# Patient Record
Sex: Female | Born: 1945 | Race: White | Hispanic: No | Marital: Married
Health system: Southern US, Community
[De-identification: ages and names within clinical notes are randomized; demographics above are authoritative.]

---

## 2001-12-18 ENCOUNTER — Ambulatory Visit (HOSPITAL_COMMUNITY): Admission: RE | Admit: 2001-12-18 | Discharge: 2001-12-18 | Payer: Self-pay | Admitting: *Deleted

## 2004-08-27 ENCOUNTER — Other Ambulatory Visit: Admission: RE | Admit: 2004-08-27 | Discharge: 2004-08-27 | Payer: Self-pay | Admitting: *Deleted

## 2005-10-09 ENCOUNTER — Other Ambulatory Visit: Admission: RE | Admit: 2005-10-09 | Discharge: 2005-10-09 | Payer: Self-pay | Admitting: *Deleted

## 2005-10-29 ENCOUNTER — Encounter: Admission: RE | Admit: 2005-10-29 | Discharge: 2005-10-29 | Payer: Self-pay | Admitting: Family Medicine

## 2005-12-06 ENCOUNTER — Ambulatory Visit (HOSPITAL_COMMUNITY): Admission: RE | Admit: 2005-12-06 | Discharge: 2005-12-06 | Payer: Self-pay | Admitting: Urology

## 2005-12-25 ENCOUNTER — Ambulatory Visit (HOSPITAL_COMMUNITY): Admission: RE | Admit: 2005-12-25 | Discharge: 2005-12-25 | Payer: Self-pay | Admitting: Surgery

## 2006-01-06 ENCOUNTER — Ambulatory Visit (HOSPITAL_BASED_OUTPATIENT_CLINIC_OR_DEPARTMENT_OTHER): Admission: RE | Admit: 2006-01-06 | Discharge: 2006-01-06 | Payer: Self-pay | Admitting: Surgery

## 2008-04-14 IMAGING — CT CT BIOPSY
1 series · 16 of 32 positions shown, 20 images · non-contrast
Comparison: none

CLINICAL DATA: Fat containing mass in the left rectus sheath noted on abdomen and pelvis CT.  She denies any history of growth or pain in the area.  
 CT GUIDED LEFT RECTUS SHEATH FATTY MASS BIOPSY:
 Procedure:  The left lower abdomen was prepped and draped in a sterile fashion.  Lidocaine was utilized for local anesthesia.  Under CT guidance, a 17-gauge guide needle was inserted into the fatty mass.  The tip was positioned in a soft tissue density within the mass.  
 Five 18-gauge core biopsy passes were performed.  The needle was removed.  Repeat imaging demonstrates no hematoma.

[Series 2: routine pelvis · axial · 0.81mm/px · z∈[-61,+29]mm · 16 of 38 slices shown, 20 images]
[im 3/38  soft-tissue]
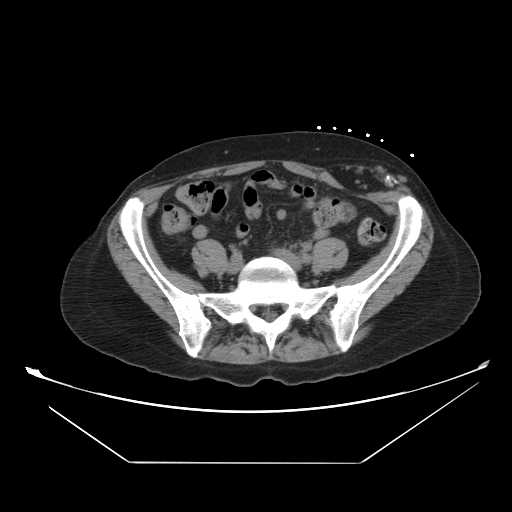
[im 3/38  bone]
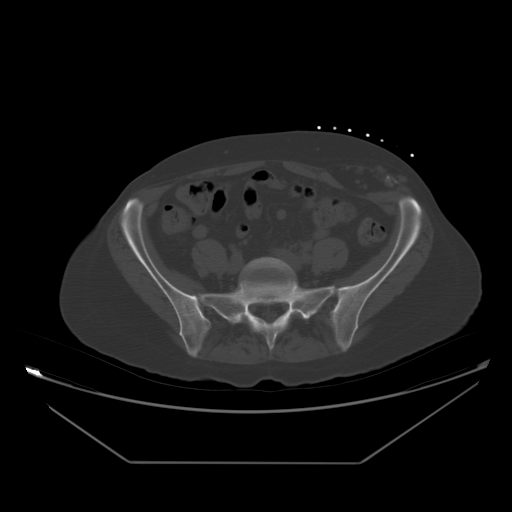
[im 5/38  soft-tissue]
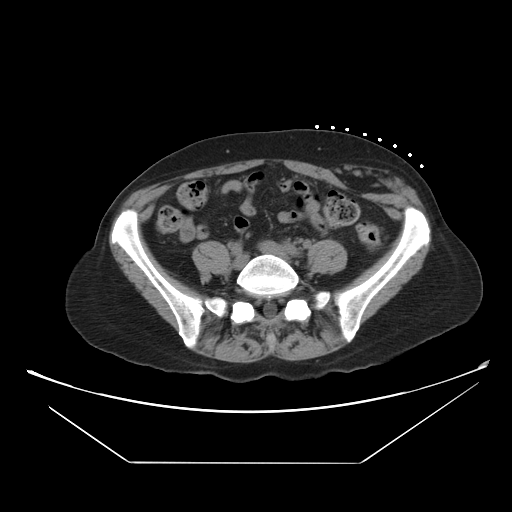
[im 8/38  soft-tissue]
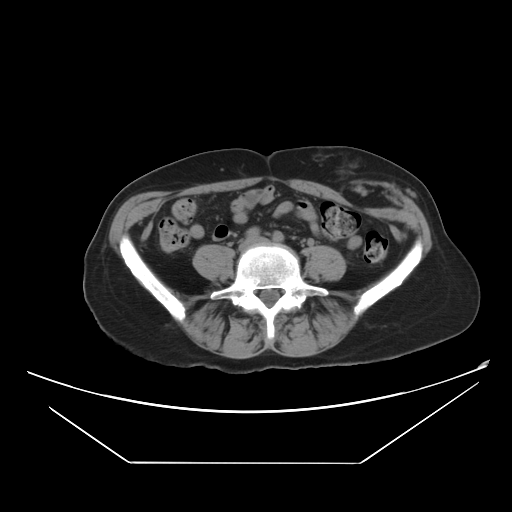
[im 10/38  soft-tissue]
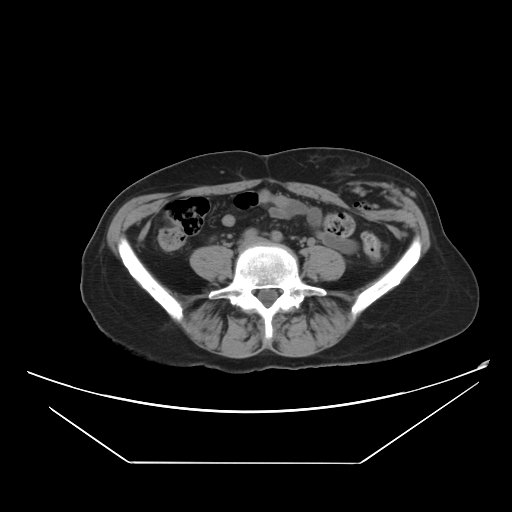
[im 12/38  soft-tissue]
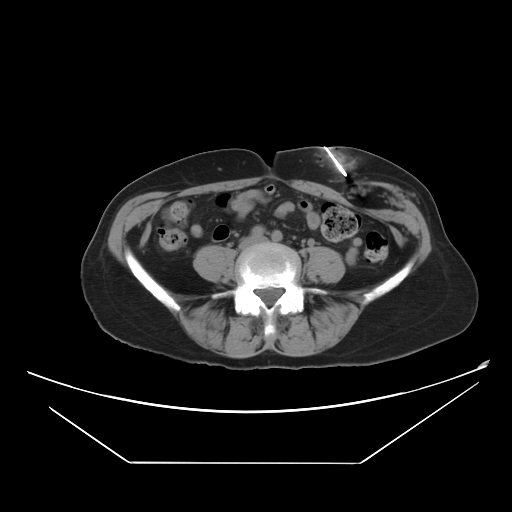
[im 15/38  soft-tissue]
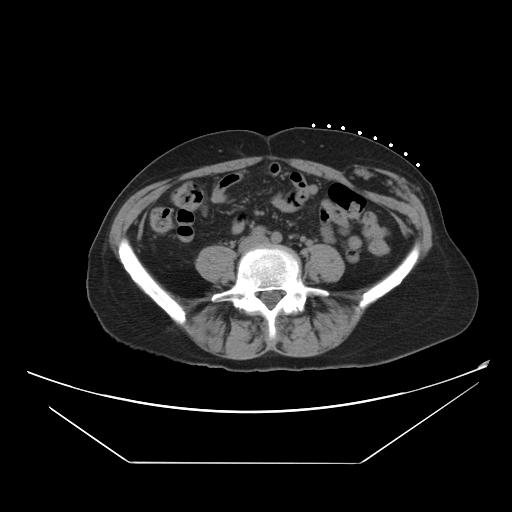
[im 17/38  soft-tissue]
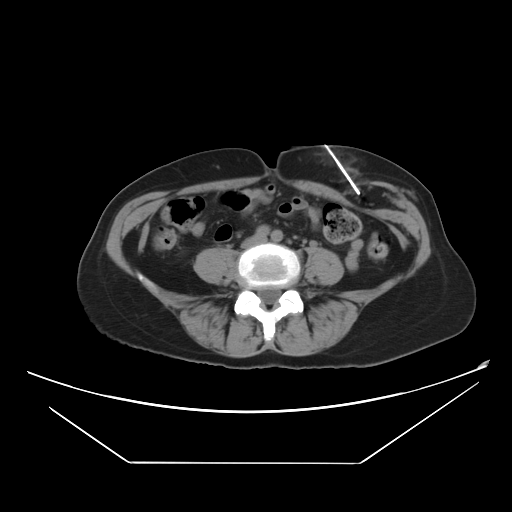
[im 21/38  soft-tissue]
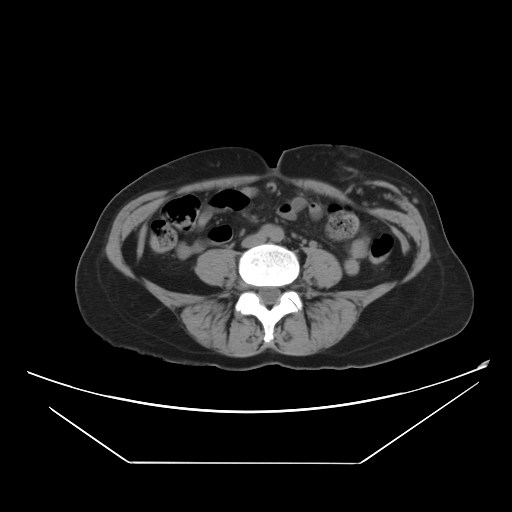
[im 23/38  soft-tissue]
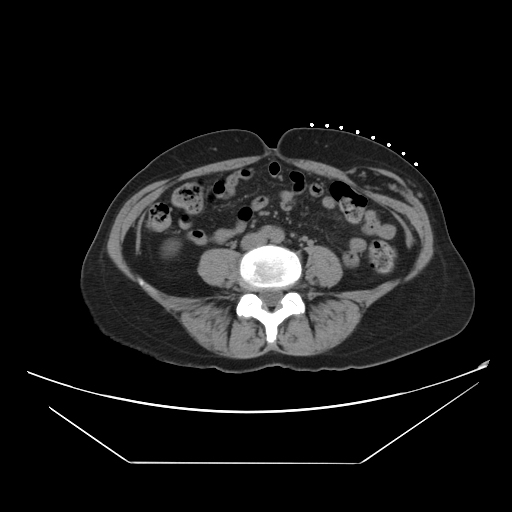
[im 23/38  bone]
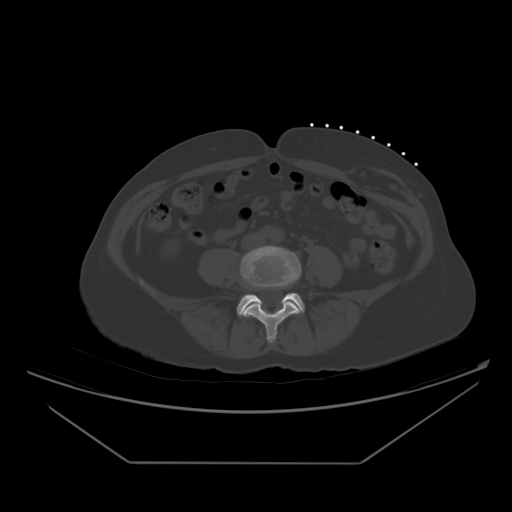
[im 26/38  soft-tissue]
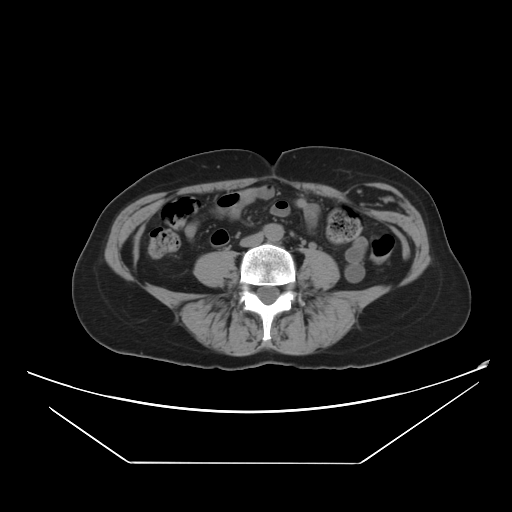
[im 28/38  soft-tissue]
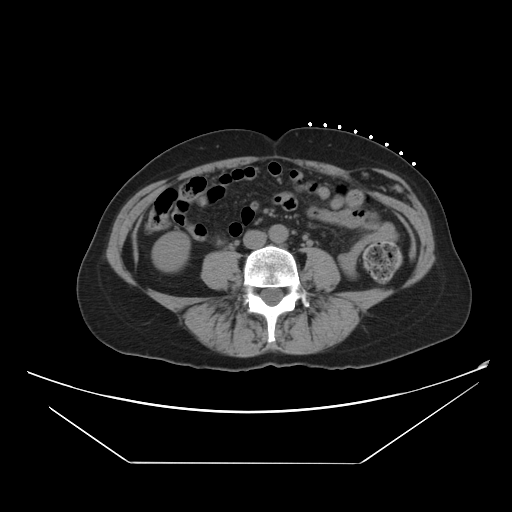
[im 30/38  soft-tissue]
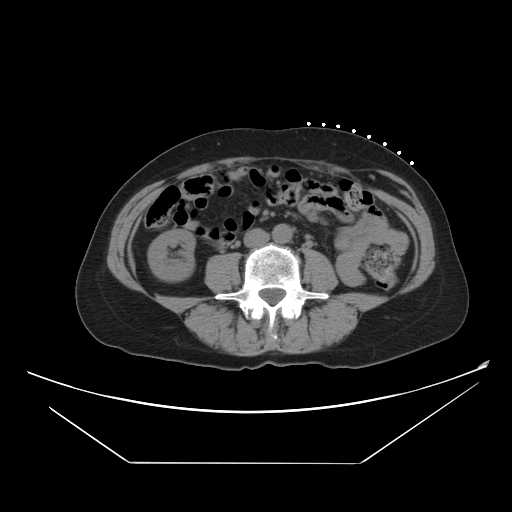
[im 33/38  soft-tissue]
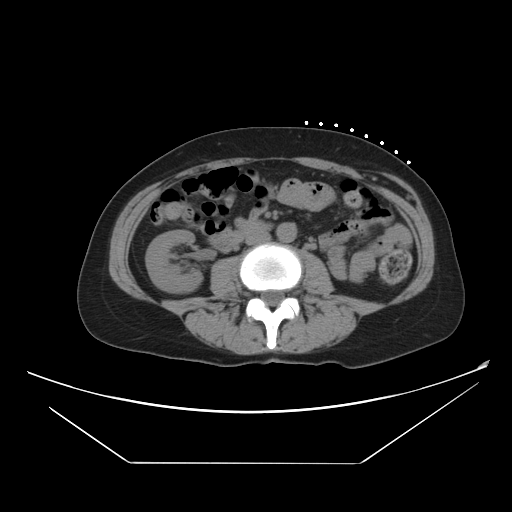
[im 33/38  lung]
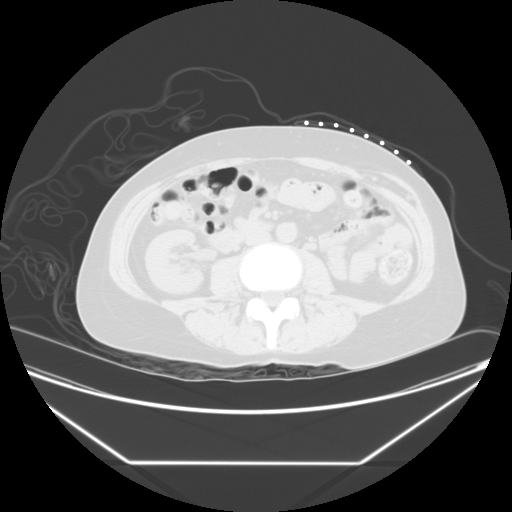
[im 34/38  lung]
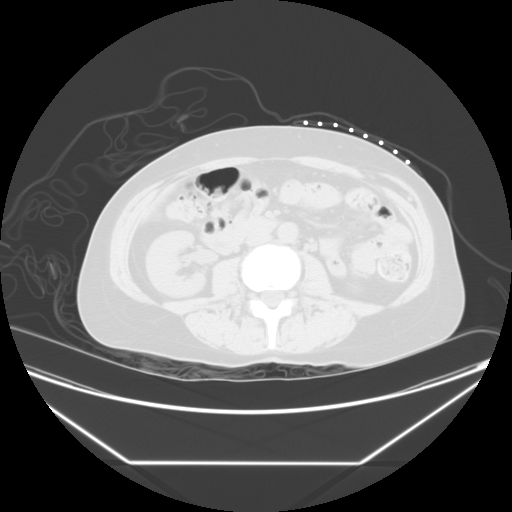
[im 35/38  soft-tissue]
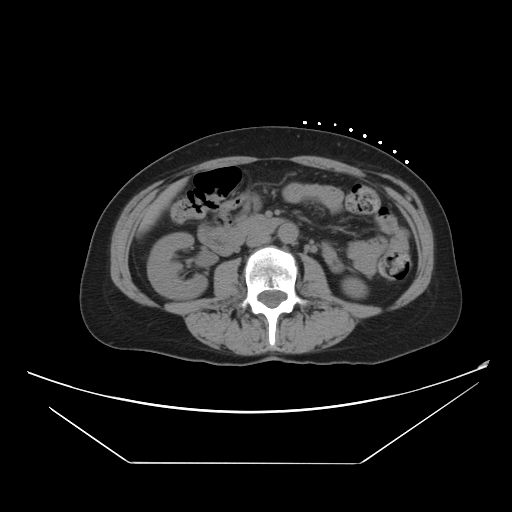
[im 35/38  lung]
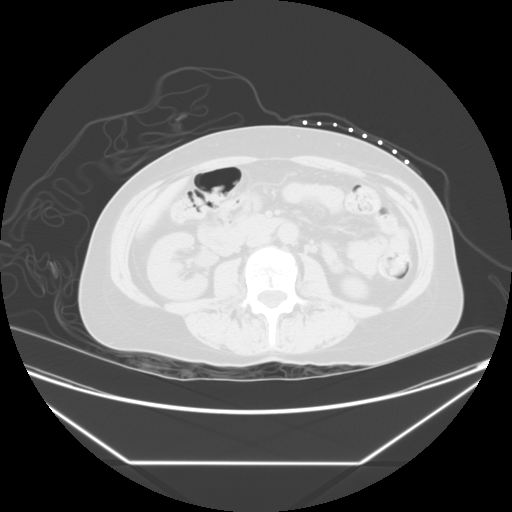
[im 36/38  lung]
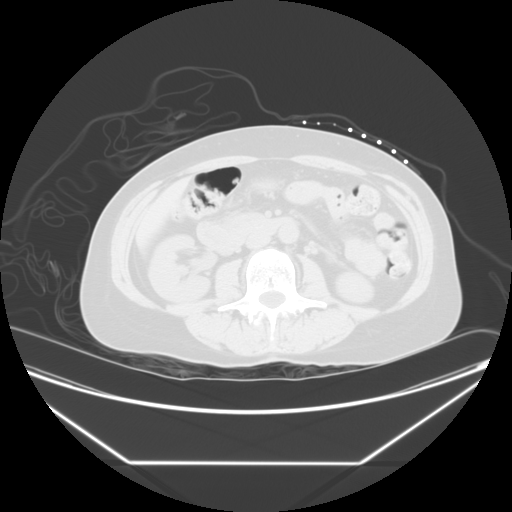

[16 of 32 positions shown; findings below may reference images not displayed]

FINDINGS: Images document needle placement in the left rectus fatty mass.  Post biopsy images demonstrate no hematoma.
IMPRESSION: Successful core biopsy of a superficial left rectus sheath fatty mass.

## 2010-12-28 ENCOUNTER — Other Ambulatory Visit: Payer: Self-pay | Admitting: Family Medicine

## 2011-01-02 ENCOUNTER — Ambulatory Visit
Admission: RE | Admit: 2011-01-02 | Discharge: 2011-01-02 | Disposition: A | Discharge status: Home / Self Care | Payer: BC Managed Care – PPO | Source: Ambulatory Visit | Attending: Family Medicine | Admitting: Family Medicine

## 2012-11-29 ENCOUNTER — Other Ambulatory Visit: Payer: Self-pay | Admitting: Family Medicine

## 2012-11-29 ENCOUNTER — Other Ambulatory Visit (HOSPITAL_COMMUNITY)
Admission: RE | Admit: 2012-11-29 | Discharge: 2012-11-29 | Disposition: A | Discharge status: Home / Self Care | Payer: Medicare Other | Source: Ambulatory Visit | Attending: Family Medicine | Admitting: Family Medicine

## 2012-11-29 DIAGNOSIS — Z1151 Encounter for screening for human papillomavirus (HPV): Secondary | ICD-10-CM | POA: Insufficient documentation

## 2012-11-29 DIAGNOSIS — Z124 Encounter for screening for malignant neoplasm of cervix: Secondary | ICD-10-CM | POA: Insufficient documentation

## 2013-04-22 IMAGING — US US ABDOMEN COMPLETE
1 series · 14 of 25 positions shown · non-contrast
Comparison: MR abdomen of 12/06/2005

CLINICAL DATA: Elevated liver function tests

COMPLETE ABDOMINAL ULTRASOUND

[Series 1: us abdomen complete · 0.24mm/px · 14 of 94 slices shown]
[im 1/94]
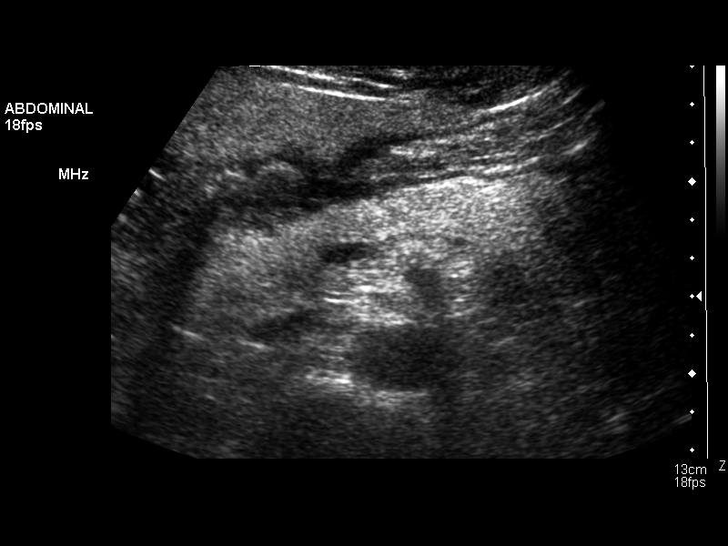
[im 8/94]
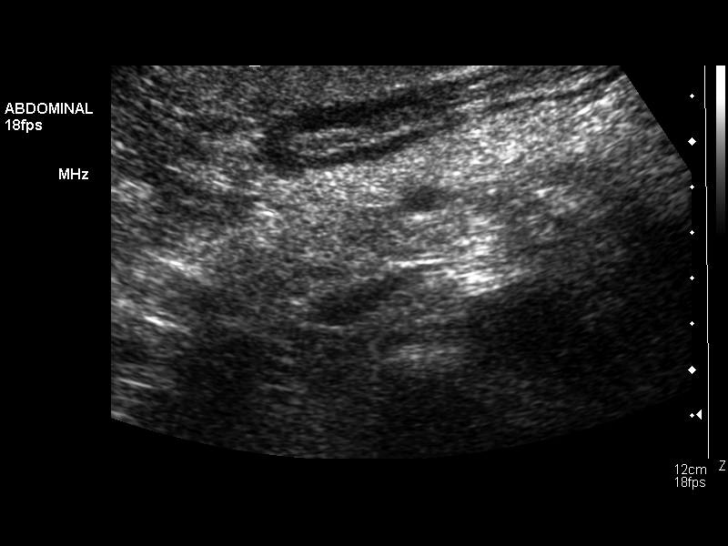
[im 16/94]
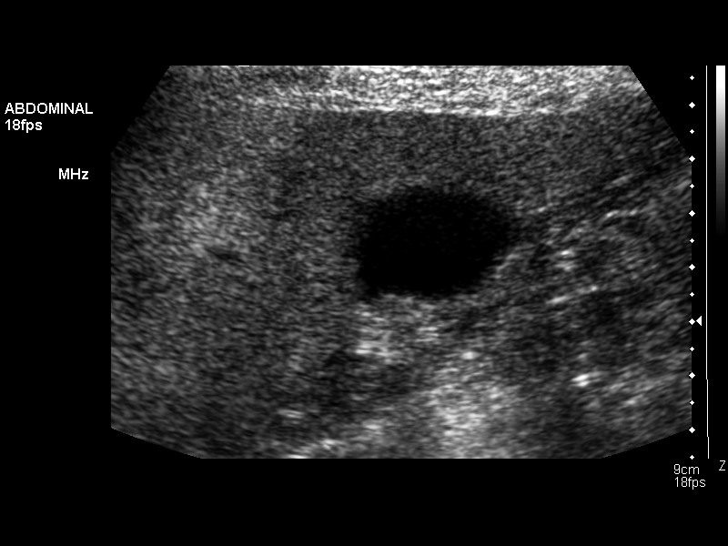
[im 24/94]
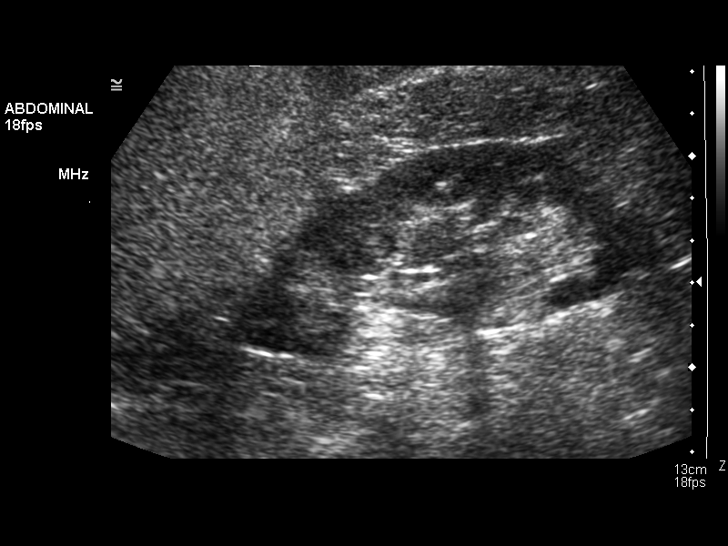
[im 32/94]
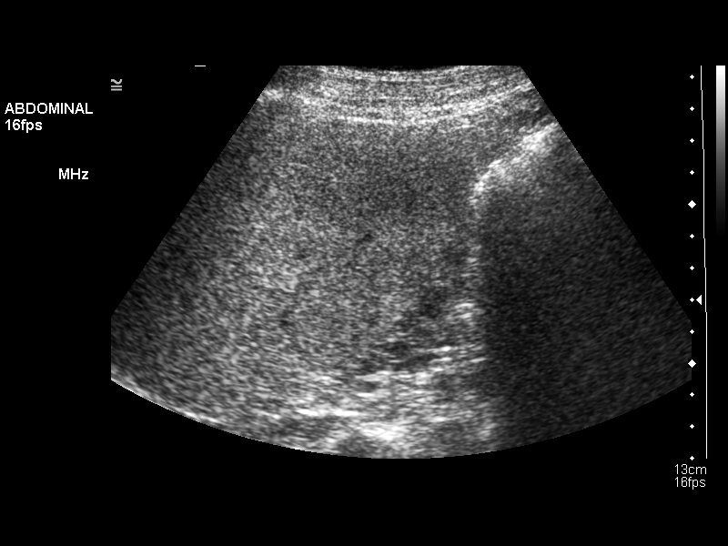
[im 35/94]
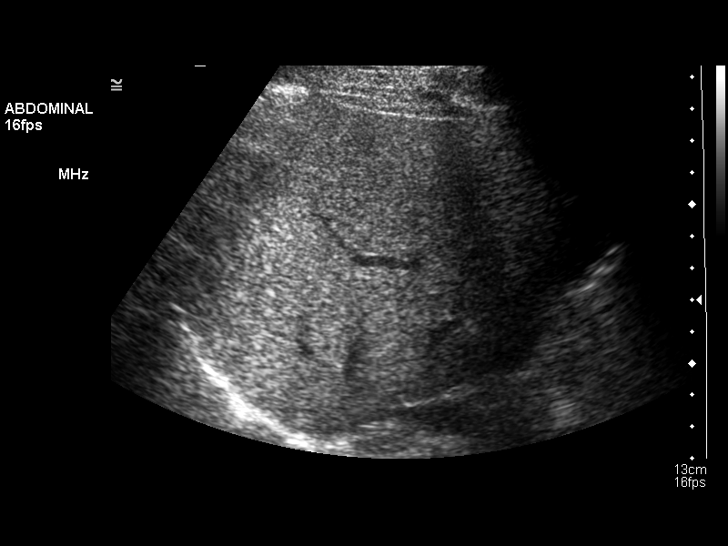
[im 43/94]
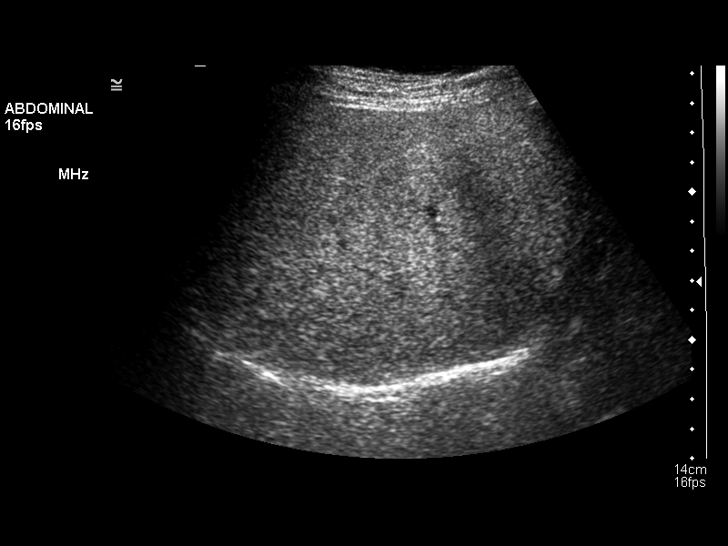
[im 51/94]
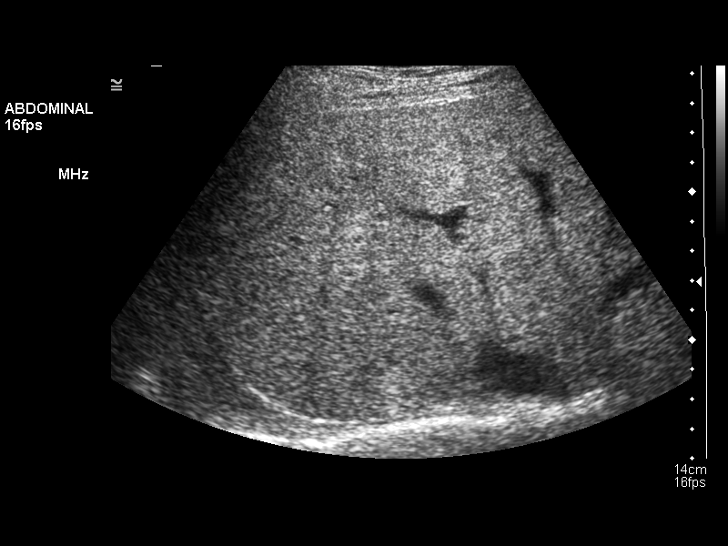
[im 59/94]
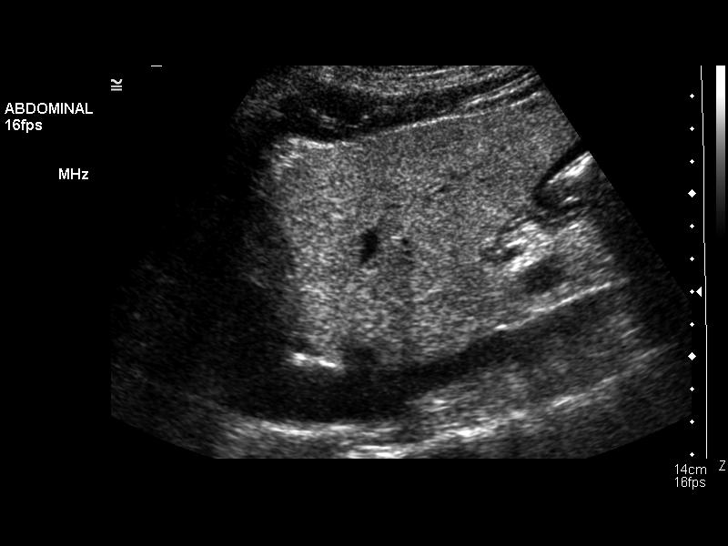
[im 63/94]
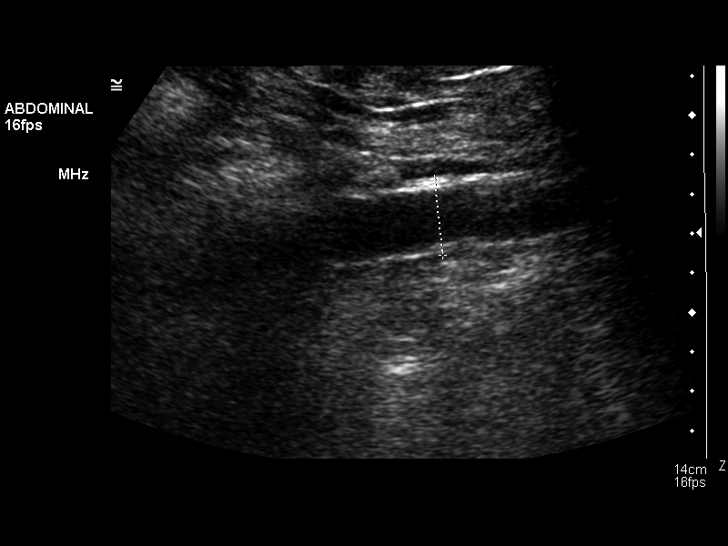
[im 70/94]
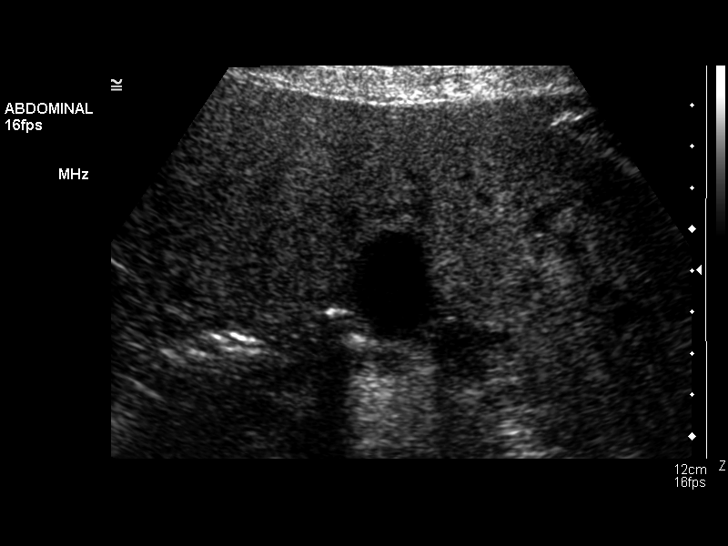
[im 78/94]
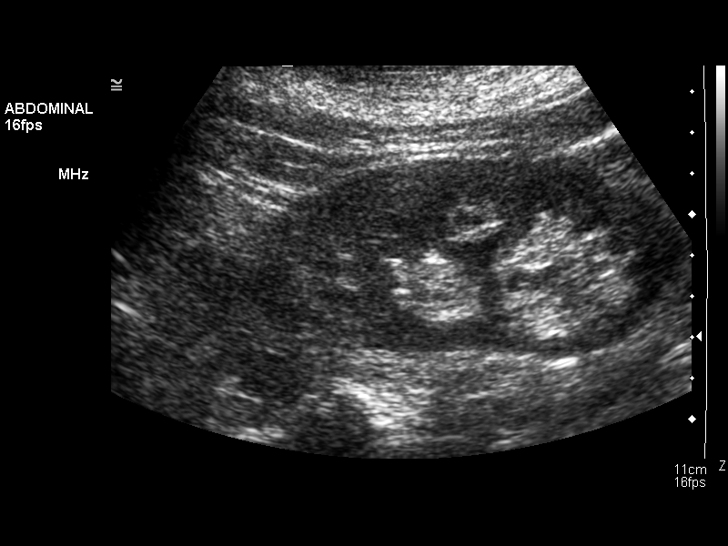
[im 86/94]
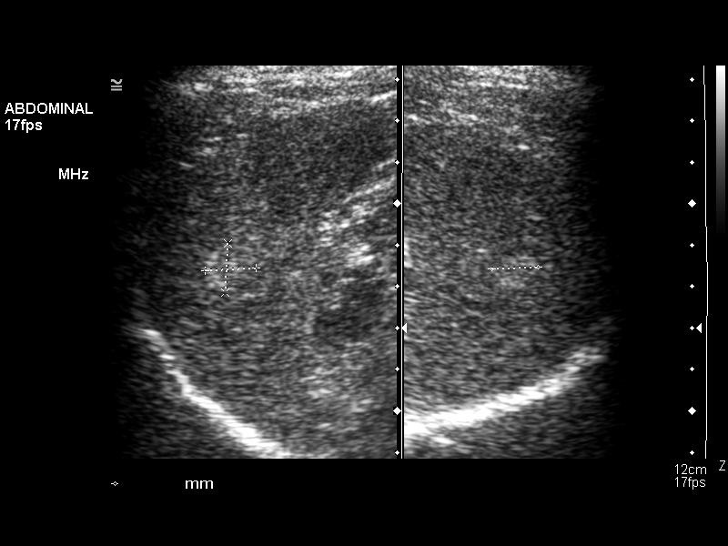
[im 94/94]
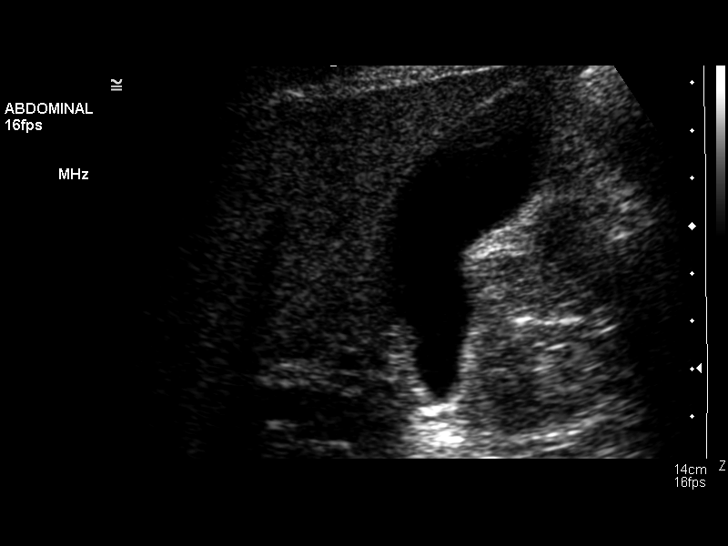

[14 of 25 positions shown; findings below may reference images not displayed]

FINDINGS: Gallbladder:  The gallbladder is visualized and no gallstones are
noted.  No pain is present over gallbladder with compression.

Common bile duct:  The common bile duct is normal measuring 4.6 mm
in diameter.

Liver:  The liver is diffusely echogenic consistent with fatty
infiltration.  No focal abnormality is seen.

IVC:  Appears normal.

Pancreas:  The

Spleen:  The spleen is normal in size measuring 5.1 cm sagittally.
There is a poorly defined echogenic focus of 1.2 cm which may
represent an incidental hemangioma.  A small splenic cyst is
present of 1.6 cm in diameter.

Right Kidney:  No hydronephrosis is seen.  The right kidney
measures 10.1 cm sagittally.

Left Kidney:  No hydronephrosis.  The left kidney measures 11.3 cm.

Abdominal aorta:  The abdominal aorta is normal in caliber.
IMPRESSION: 1.  Diffuse fatty infiltration of the liver.  No focal abnormality.
2.  No gallstones.
3.  Slightly echogenic focus in the spleen may represent an
incidental hemangioma.

## 2014-07-06 DIAGNOSIS — H269 Unspecified cataract: Secondary | ICD-10-CM | POA: Diagnosis not present

## 2014-07-06 DIAGNOSIS — I10 Essential (primary) hypertension: Secondary | ICD-10-CM | POA: Diagnosis not present

## 2014-07-06 DIAGNOSIS — E785 Hyperlipidemia, unspecified: Secondary | ICD-10-CM | POA: Diagnosis not present

## 2014-07-06 DIAGNOSIS — F419 Anxiety disorder, unspecified: Secondary | ICD-10-CM | POA: Diagnosis not present

## 2014-07-26 DIAGNOSIS — H2512 Age-related nuclear cataract, left eye: Secondary | ICD-10-CM | POA: Diagnosis not present

## 2014-07-26 DIAGNOSIS — H02839 Dermatochalasis of unspecified eye, unspecified eyelid: Secondary | ICD-10-CM | POA: Diagnosis not present

## 2014-07-26 DIAGNOSIS — H18412 Arcus senilis, left eye: Secondary | ICD-10-CM | POA: Diagnosis not present

## 2014-07-26 DIAGNOSIS — H2511 Age-related nuclear cataract, right eye: Secondary | ICD-10-CM | POA: Diagnosis not present

## 2014-07-26 DIAGNOSIS — H18411 Arcus senilis, right eye: Secondary | ICD-10-CM | POA: Diagnosis not present

## 2014-09-19 DIAGNOSIS — H259 Unspecified age-related cataract: Secondary | ICD-10-CM | POA: Diagnosis not present

## 2014-09-19 DIAGNOSIS — H25812 Combined forms of age-related cataract, left eye: Secondary | ICD-10-CM | POA: Diagnosis not present

## 2014-09-19 DIAGNOSIS — H2512 Age-related nuclear cataract, left eye: Secondary | ICD-10-CM | POA: Diagnosis not present

## 2014-09-20 DIAGNOSIS — H2511 Age-related nuclear cataract, right eye: Secondary | ICD-10-CM | POA: Diagnosis not present

## 2014-10-03 DIAGNOSIS — H25811 Combined forms of age-related cataract, right eye: Secondary | ICD-10-CM | POA: Diagnosis not present

## 2014-10-03 DIAGNOSIS — H2511 Age-related nuclear cataract, right eye: Secondary | ICD-10-CM | POA: Diagnosis not present

## 2014-10-04 DIAGNOSIS — H2589 Other age-related cataract: Secondary | ICD-10-CM | POA: Diagnosis not present

## 2014-12-05 DIAGNOSIS — I10 Essential (primary) hypertension: Secondary | ICD-10-CM | POA: Diagnosis not present

## 2014-12-05 DIAGNOSIS — M67449 Ganglion, unspecified hand: Secondary | ICD-10-CM | POA: Diagnosis not present

## 2014-12-05 DIAGNOSIS — Z0000 Encounter for general adult medical examination without abnormal findings: Secondary | ICD-10-CM | POA: Diagnosis not present

## 2014-12-05 DIAGNOSIS — F419 Anxiety disorder, unspecified: Secondary | ICD-10-CM | POA: Diagnosis not present

## 2014-12-05 DIAGNOSIS — E785 Hyperlipidemia, unspecified: Secondary | ICD-10-CM | POA: Diagnosis not present

## 2014-12-19 DIAGNOSIS — Z0100 Encounter for examination of eyes and vision without abnormal findings: Secondary | ICD-10-CM | POA: Diagnosis not present

## 2014-12-30 DIAGNOSIS — Z1231 Encounter for screening mammogram for malignant neoplasm of breast: Secondary | ICD-10-CM | POA: Diagnosis not present

## 2015-02-02 ENCOUNTER — Other Ambulatory Visit: Payer: Self-pay | Admitting: Gastroenterology

## 2015-02-02 DIAGNOSIS — D126 Benign neoplasm of colon, unspecified: Secondary | ICD-10-CM | POA: Diagnosis not present

## 2015-02-02 DIAGNOSIS — D123 Benign neoplasm of transverse colon: Secondary | ICD-10-CM | POA: Diagnosis not present

## 2015-02-02 DIAGNOSIS — Z86010 Personal history of colonic polyps: Secondary | ICD-10-CM | POA: Diagnosis not present

## 2015-02-02 DIAGNOSIS — Z09 Encounter for follow-up examination after completed treatment for conditions other than malignant neoplasm: Secondary | ICD-10-CM | POA: Diagnosis not present

## 2015-12-11 DIAGNOSIS — M8588 Other specified disorders of bone density and structure, other site: Secondary | ICD-10-CM | POA: Diagnosis not present

## 2015-12-11 DIAGNOSIS — I10 Essential (primary) hypertension: Secondary | ICD-10-CM | POA: Diagnosis not present

## 2015-12-11 DIAGNOSIS — E7800 Pure hypercholesterolemia, unspecified: Secondary | ICD-10-CM | POA: Diagnosis not present

## 2015-12-11 DIAGNOSIS — F411 Generalized anxiety disorder: Secondary | ICD-10-CM | POA: Diagnosis not present

## 2015-12-11 DIAGNOSIS — Z0000 Encounter for general adult medical examination without abnormal findings: Secondary | ICD-10-CM | POA: Diagnosis not present

## 2016-01-05 DIAGNOSIS — M8589 Other specified disorders of bone density and structure, multiple sites: Secondary | ICD-10-CM | POA: Diagnosis not present

## 2016-01-05 DIAGNOSIS — Z1231 Encounter for screening mammogram for malignant neoplasm of breast: Secondary | ICD-10-CM | POA: Diagnosis not present

## 2016-01-17 DIAGNOSIS — H5201 Hypermetropia, right eye: Secondary | ICD-10-CM | POA: Diagnosis not present

## 2016-01-17 DIAGNOSIS — H5210 Myopia, unspecified eye: Secondary | ICD-10-CM | POA: Diagnosis not present

## 2016-01-30 DIAGNOSIS — H40013 Open angle with borderline findings, low risk, bilateral: Secondary | ICD-10-CM | POA: Diagnosis not present

## 2016-05-31 DIAGNOSIS — H40013 Open angle with borderline findings, low risk, bilateral: Secondary | ICD-10-CM | POA: Diagnosis not present

## 2016-10-08 DIAGNOSIS — H40013 Open angle with borderline findings, low risk, bilateral: Secondary | ICD-10-CM | POA: Diagnosis not present

## 2016-12-16 DIAGNOSIS — F411 Generalized anxiety disorder: Secondary | ICD-10-CM | POA: Diagnosis not present

## 2016-12-16 DIAGNOSIS — Z0000 Encounter for general adult medical examination without abnormal findings: Secondary | ICD-10-CM | POA: Diagnosis not present

## 2016-12-16 DIAGNOSIS — G4720 Circadian rhythm sleep disorder, unspecified type: Secondary | ICD-10-CM | POA: Diagnosis not present

## 2016-12-16 DIAGNOSIS — I10 Essential (primary) hypertension: Secondary | ICD-10-CM | POA: Diagnosis not present

## 2016-12-16 DIAGNOSIS — E7800 Pure hypercholesterolemia, unspecified: Secondary | ICD-10-CM | POA: Diagnosis not present

## 2017-01-07 DIAGNOSIS — Z1231 Encounter for screening mammogram for malignant neoplasm of breast: Secondary | ICD-10-CM | POA: Diagnosis not present

## 2017-03-17 DIAGNOSIS — H5201 Hypermetropia, right eye: Secondary | ICD-10-CM | POA: Diagnosis not present

## 2017-03-17 DIAGNOSIS — H52223 Regular astigmatism, bilateral: Secondary | ICD-10-CM | POA: Diagnosis not present

## 2017-09-02 DIAGNOSIS — Z83511 Family history of glaucoma: Secondary | ICD-10-CM | POA: Diagnosis not present

## 2017-09-02 DIAGNOSIS — H18893 Other specified disorders of cornea, bilateral: Secondary | ICD-10-CM | POA: Diagnosis not present

## 2017-09-02 DIAGNOSIS — H40013 Open angle with borderline findings, low risk, bilateral: Secondary | ICD-10-CM | POA: Diagnosis not present

## 2017-12-19 DIAGNOSIS — H40013 Open angle with borderline findings, low risk, bilateral: Secondary | ICD-10-CM | POA: Diagnosis not present

## 2017-12-22 DIAGNOSIS — E7800 Pure hypercholesterolemia, unspecified: Secondary | ICD-10-CM | POA: Diagnosis not present

## 2017-12-22 DIAGNOSIS — Z136 Encounter for screening for cardiovascular disorders: Secondary | ICD-10-CM | POA: Diagnosis not present

## 2017-12-22 DIAGNOSIS — M8580 Other specified disorders of bone density and structure, unspecified site: Secondary | ICD-10-CM | POA: Diagnosis not present

## 2017-12-22 DIAGNOSIS — I10 Essential (primary) hypertension: Secondary | ICD-10-CM | POA: Diagnosis not present

## 2017-12-22 DIAGNOSIS — F411 Generalized anxiety disorder: Secondary | ICD-10-CM | POA: Diagnosis not present

## 2017-12-22 DIAGNOSIS — G479 Sleep disorder, unspecified: Secondary | ICD-10-CM | POA: Diagnosis not present

## 2017-12-22 DIAGNOSIS — Z0000 Encounter for general adult medical examination without abnormal findings: Secondary | ICD-10-CM | POA: Diagnosis not present

## 2018-01-07 DIAGNOSIS — Z1231 Encounter for screening mammogram for malignant neoplasm of breast: Secondary | ICD-10-CM | POA: Diagnosis not present

## 2018-01-07 DIAGNOSIS — M8589 Other specified disorders of bone density and structure, multiple sites: Secondary | ICD-10-CM | POA: Diagnosis not present

## 2018-03-24 DIAGNOSIS — H524 Presbyopia: Secondary | ICD-10-CM | POA: Diagnosis not present

## 2018-07-28 DIAGNOSIS — H40013 Open angle with borderline findings, low risk, bilateral: Secondary | ICD-10-CM | POA: Diagnosis not present

## 2018-12-21 DIAGNOSIS — H40023 Open angle with borderline findings, high risk, bilateral: Secondary | ICD-10-CM | POA: Diagnosis not present

## 2018-12-28 DIAGNOSIS — Z0000 Encounter for general adult medical examination without abnormal findings: Secondary | ICD-10-CM | POA: Diagnosis not present

## 2019-01-06 DIAGNOSIS — F411 Generalized anxiety disorder: Secondary | ICD-10-CM | POA: Diagnosis not present

## 2019-01-06 DIAGNOSIS — I10 Essential (primary) hypertension: Secondary | ICD-10-CM | POA: Diagnosis not present

## 2019-01-06 DIAGNOSIS — E7800 Pure hypercholesterolemia, unspecified: Secondary | ICD-10-CM | POA: Diagnosis not present

## 2019-01-13 DIAGNOSIS — Z1231 Encounter for screening mammogram for malignant neoplasm of breast: Secondary | ICD-10-CM | POA: Diagnosis not present

## 2019-03-08 DIAGNOSIS — H40023 Open angle with borderline findings, high risk, bilateral: Secondary | ICD-10-CM | POA: Diagnosis not present

## 2019-03-16 DIAGNOSIS — E7800 Pure hypercholesterolemia, unspecified: Secondary | ICD-10-CM | POA: Diagnosis not present

## 2019-03-16 DIAGNOSIS — I10 Essential (primary) hypertension: Secondary | ICD-10-CM | POA: Diagnosis not present

## 2019-03-16 DIAGNOSIS — Z23 Encounter for immunization: Secondary | ICD-10-CM | POA: Diagnosis not present

## 2019-04-13 DIAGNOSIS — H524 Presbyopia: Secondary | ICD-10-CM | POA: Diagnosis not present

## 2019-05-03 DIAGNOSIS — Z0100 Encounter for examination of eyes and vision without abnormal findings: Secondary | ICD-10-CM | POA: Diagnosis not present

## 2019-07-14 DIAGNOSIS — H40023 Open angle with borderline findings, high risk, bilateral: Secondary | ICD-10-CM | POA: Diagnosis not present

## 2019-07-30 DIAGNOSIS — E7800 Pure hypercholesterolemia, unspecified: Secondary | ICD-10-CM | POA: Diagnosis not present

## 2019-07-30 DIAGNOSIS — I10 Essential (primary) hypertension: Secondary | ICD-10-CM | POA: Diagnosis not present

## 2019-08-08 ENCOUNTER — Ambulatory Visit: Payer: Medicare HMO | Attending: Internal Medicine

## 2019-08-08 ENCOUNTER — Other Ambulatory Visit: Payer: Self-pay

## 2019-08-08 DIAGNOSIS — Z23 Encounter for immunization: Secondary | ICD-10-CM | POA: Insufficient documentation

## 2019-08-08 NOTE — Progress Notes (Signed)
   Covid-19 Vaccination Clinic  Name:  Robin Lowe    MRN: [id number] DOB: 03-20-1946  08/08/2019  [name] was observed post Covid-19 immunization for 15 minutes without incidence. She was provided with Vaccine Information Sheet and instruction to access the V-Safe system.   [name] was instructed to call 911 with any severe reactions post vaccine: Marland Kitchen Difficulty breathing  . Swelling of your face and throat  . A fast heartbeat  . A bad rash all over your body  . Dizziness and weakness    Immunizations Administered    Name Date Dose VIS Date Route   Pfizer COVID-19 Vaccine 08/08/2019 12:38 PM 0.3 mL 05/21/2019 Intramuscular   Manufacturer: [place]   Lot: [id number]   [place]: [id number]

## 2019-09-03 DIAGNOSIS — E7800 Pure hypercholesterolemia, unspecified: Secondary | ICD-10-CM | POA: Diagnosis not present

## 2019-09-03 DIAGNOSIS — I10 Essential (primary) hypertension: Secondary | ICD-10-CM | POA: Diagnosis not present

## 2019-09-07 ENCOUNTER — Ambulatory Visit: Payer: Medicare HMO | Attending: Internal Medicine

## 2019-09-07 DIAGNOSIS — Z23 Encounter for immunization: Secondary | ICD-10-CM

## 2019-09-07 NOTE — Progress Notes (Signed)
   Covid-19 Vaccination Clinic  Name:  Robin Lowe    MRN: [id number] DOB: 11/22/1945  09/07/2019  [name] was observed post Covid-19 immunization for 15 minutes without incident. She was provided with Vaccine Information Sheet and instruction to access the V-Safe system.   [name] was instructed to call 911 with any severe reactions post vaccine: Marland Kitchen Difficulty breathing  . Swelling of face and throat  . A fast heartbeat  . A bad rash all over body  . Dizziness and weakness   Immunizations Administered    Name Date Dose VIS Date Route   Pfizer COVID-19 Vaccine 09/07/2019 11:49 AM 0.3 mL 05/21/2019 Intramuscular   Manufacturer: [place]   Lot: [id number]   [place]: [id number]

## 2019-09-20 DIAGNOSIS — I10 Essential (primary) hypertension: Secondary | ICD-10-CM | POA: Diagnosis not present

## 2019-09-20 DIAGNOSIS — E7800 Pure hypercholesterolemia, unspecified: Secondary | ICD-10-CM | POA: Diagnosis not present

## 2019-11-02 DIAGNOSIS — E7800 Pure hypercholesterolemia, unspecified: Secondary | ICD-10-CM | POA: Diagnosis not present

## 2019-11-02 DIAGNOSIS — I10 Essential (primary) hypertension: Secondary | ICD-10-CM | POA: Diagnosis not present

## 2019-11-10 DIAGNOSIS — H40023 Open angle with borderline findings, high risk, bilateral: Secondary | ICD-10-CM | POA: Diagnosis not present

## 2020-01-17 DIAGNOSIS — G479 Sleep disorder, unspecified: Secondary | ICD-10-CM | POA: Diagnosis not present

## 2020-01-17 DIAGNOSIS — E7800 Pure hypercholesterolemia, unspecified: Secondary | ICD-10-CM | POA: Diagnosis not present

## 2020-01-17 DIAGNOSIS — I10 Essential (primary) hypertension: Secondary | ICD-10-CM | POA: Diagnosis not present

## 2020-01-17 DIAGNOSIS — F411 Generalized anxiety disorder: Secondary | ICD-10-CM | POA: Diagnosis not present

## 2020-01-17 DIAGNOSIS — Z0000 Encounter for general adult medical examination without abnormal findings: Secondary | ICD-10-CM | POA: Diagnosis not present

## 2020-01-19 DIAGNOSIS — Z1231 Encounter for screening mammogram for malignant neoplasm of breast: Secondary | ICD-10-CM | POA: Diagnosis not present

## 2020-03-09 DIAGNOSIS — I10 Essential (primary) hypertension: Secondary | ICD-10-CM | POA: Diagnosis not present

## 2020-03-09 DIAGNOSIS — E7800 Pure hypercholesterolemia, unspecified: Secondary | ICD-10-CM | POA: Diagnosis not present

## 2020-03-14 DIAGNOSIS — Z23 Encounter for immunization: Secondary | ICD-10-CM | POA: Diagnosis not present

## 2020-03-14 DIAGNOSIS — R945 Abnormal results of liver function studies: Secondary | ICD-10-CM | POA: Diagnosis not present

## 2020-04-05 DIAGNOSIS — Z0100 Encounter for examination of eyes and vision without abnormal findings: Secondary | ICD-10-CM | POA: Diagnosis not present

## 2020-04-05 DIAGNOSIS — H40013 Open angle with borderline findings, low risk, bilateral: Secondary | ICD-10-CM | POA: Diagnosis not present

## 2020-08-03 DIAGNOSIS — H40013 Open angle with borderline findings, low risk, bilateral: Secondary | ICD-10-CM | POA: Diagnosis not present

## 2021-01-12 DIAGNOSIS — H40023 Open angle with borderline findings, high risk, bilateral: Secondary | ICD-10-CM | POA: Diagnosis not present

## 2021-01-17 DIAGNOSIS — Z0000 Encounter for general adult medical examination without abnormal findings: Secondary | ICD-10-CM | POA: Diagnosis not present

## 2021-01-17 DIAGNOSIS — Z1389 Encounter for screening for other disorder: Secondary | ICD-10-CM | POA: Diagnosis not present

## 2021-01-19 DIAGNOSIS — Z1231 Encounter for screening mammogram for malignant neoplasm of breast: Secondary | ICD-10-CM | POA: Diagnosis not present

## 2021-01-29 DIAGNOSIS — Z0000 Encounter for general adult medical examination without abnormal findings: Secondary | ICD-10-CM | POA: Diagnosis not present

## 2021-01-29 DIAGNOSIS — G4720 Circadian rhythm sleep disorder, unspecified type: Secondary | ICD-10-CM | POA: Diagnosis not present

## 2021-01-29 DIAGNOSIS — I10 Essential (primary) hypertension: Secondary | ICD-10-CM | POA: Diagnosis not present

## 2021-01-29 DIAGNOSIS — N3946 Mixed incontinence: Secondary | ICD-10-CM | POA: Diagnosis not present

## 2021-01-29 DIAGNOSIS — E7800 Pure hypercholesterolemia, unspecified: Secondary | ICD-10-CM | POA: Diagnosis not present

## 2021-01-29 DIAGNOSIS — F411 Generalized anxiety disorder: Secondary | ICD-10-CM | POA: Diagnosis not present

## 2021-04-06 DIAGNOSIS — H524 Presbyopia: Secondary | ICD-10-CM | POA: Diagnosis not present

## 2021-08-08 DIAGNOSIS — H40023 Open angle with borderline findings, high risk, bilateral: Secondary | ICD-10-CM | POA: Diagnosis not present

## 2021-08-23 DIAGNOSIS — Z86010 Personal history of colonic polyps: Secondary | ICD-10-CM | POA: Diagnosis not present

## 2021-08-23 DIAGNOSIS — D125 Benign neoplasm of sigmoid colon: Secondary | ICD-10-CM | POA: Diagnosis not present

## 2021-08-28 DIAGNOSIS — D125 Benign neoplasm of sigmoid colon: Secondary | ICD-10-CM | POA: Diagnosis not present

## 2021-12-18 DIAGNOSIS — H40023 Open angle with borderline findings, high risk, bilateral: Secondary | ICD-10-CM | POA: Diagnosis not present

## 2022-01-18 DIAGNOSIS — Z0000 Encounter for general adult medical examination without abnormal findings: Secondary | ICD-10-CM | POA: Diagnosis not present

## 2022-01-18 DIAGNOSIS — Z1389 Encounter for screening for other disorder: Secondary | ICD-10-CM | POA: Diagnosis not present

## 2022-01-22 DIAGNOSIS — Z1231 Encounter for screening mammogram for malignant neoplasm of breast: Secondary | ICD-10-CM | POA: Diagnosis not present

## 2022-02-14 DIAGNOSIS — I10 Essential (primary) hypertension: Secondary | ICD-10-CM | POA: Diagnosis not present

## 2022-02-14 DIAGNOSIS — Z23 Encounter for immunization: Secondary | ICD-10-CM | POA: Diagnosis not present

## 2022-02-14 DIAGNOSIS — E7800 Pure hypercholesterolemia, unspecified: Secondary | ICD-10-CM | POA: Diagnosis not present

## 2022-02-14 DIAGNOSIS — F411 Generalized anxiety disorder: Secondary | ICD-10-CM | POA: Diagnosis not present

## 2022-02-14 DIAGNOSIS — Z79899 Other long term (current) drug therapy: Secondary | ICD-10-CM | POA: Diagnosis not present

## 2022-02-14 DIAGNOSIS — R7309 Other abnormal glucose: Secondary | ICD-10-CM | POA: Diagnosis not present

## 2022-02-14 DIAGNOSIS — N3946 Mixed incontinence: Secondary | ICD-10-CM | POA: Diagnosis not present

## 2022-03-11 DIAGNOSIS — Z23 Encounter for immunization: Secondary | ICD-10-CM | POA: Diagnosis not present

## 2022-03-11 DIAGNOSIS — Z79899 Other long term (current) drug therapy: Secondary | ICD-10-CM | POA: Diagnosis not present

## 2022-03-11 DIAGNOSIS — R42 Dizziness and giddiness: Secondary | ICD-10-CM | POA: Diagnosis not present

## 2022-03-11 DIAGNOSIS — I951 Orthostatic hypotension: Secondary | ICD-10-CM | POA: Diagnosis not present

## 2022-03-11 DIAGNOSIS — I10 Essential (primary) hypertension: Secondary | ICD-10-CM | POA: Diagnosis not present

## 2022-03-11 DIAGNOSIS — F411 Generalized anxiety disorder: Secondary | ICD-10-CM | POA: Diagnosis not present

## 2022-04-17 DIAGNOSIS — H524 Presbyopia: Secondary | ICD-10-CM | POA: Diagnosis not present

## 2022-05-10 DIAGNOSIS — F411 Generalized anxiety disorder: Secondary | ICD-10-CM | POA: Diagnosis not present

## 2022-05-10 DIAGNOSIS — Z6827 Body mass index (BMI) 27.0-27.9, adult: Secondary | ICD-10-CM | POA: Diagnosis not present

## 2022-05-10 DIAGNOSIS — I10 Essential (primary) hypertension: Secondary | ICD-10-CM | POA: Diagnosis not present

## 2022-05-10 DIAGNOSIS — E7800 Pure hypercholesterolemia, unspecified: Secondary | ICD-10-CM | POA: Diagnosis not present

## 2022-05-10 DIAGNOSIS — Z79899 Other long term (current) drug therapy: Secondary | ICD-10-CM | POA: Diagnosis not present

## 2022-06-05 DIAGNOSIS — Z0100 Encounter for examination of eyes and vision without abnormal findings: Secondary | ICD-10-CM | POA: Diagnosis not present

## 2022-08-15 DIAGNOSIS — R7303 Prediabetes: Secondary | ICD-10-CM | POA: Diagnosis not present

## 2022-08-15 DIAGNOSIS — Z6827 Body mass index (BMI) 27.0-27.9, adult: Secondary | ICD-10-CM | POA: Diagnosis not present

## 2022-08-15 DIAGNOSIS — F411 Generalized anxiety disorder: Secondary | ICD-10-CM | POA: Diagnosis not present

## 2022-08-15 DIAGNOSIS — I10 Essential (primary) hypertension: Secondary | ICD-10-CM | POA: Diagnosis not present

## 2022-08-15 DIAGNOSIS — Z79899 Other long term (current) drug therapy: Secondary | ICD-10-CM | POA: Diagnosis not present

## 2022-08-15 DIAGNOSIS — N3946 Mixed incontinence: Secondary | ICD-10-CM | POA: Diagnosis not present

## 2022-08-15 DIAGNOSIS — E7800 Pure hypercholesterolemia, unspecified: Secondary | ICD-10-CM | POA: Diagnosis not present

## 2022-08-19 DIAGNOSIS — H40023 Open angle with borderline findings, high risk, bilateral: Secondary | ICD-10-CM | POA: Diagnosis not present

## 2022-08-19 DIAGNOSIS — H40053 Ocular hypertension, bilateral: Secondary | ICD-10-CM | POA: Diagnosis not present

## 2022-12-27 DIAGNOSIS — H40023 Open angle with borderline findings, high risk, bilateral: Secondary | ICD-10-CM | POA: Diagnosis not present

## 2023-01-20 DIAGNOSIS — E663 Overweight: Secondary | ICD-10-CM | POA: Diagnosis not present

## 2023-01-20 DIAGNOSIS — Z0000 Encounter for general adult medical examination without abnormal findings: Secondary | ICD-10-CM | POA: Diagnosis not present

## 2023-01-20 DIAGNOSIS — Z1389 Encounter for screening for other disorder: Secondary | ICD-10-CM | POA: Diagnosis not present

## 2023-01-20 DIAGNOSIS — Z9181 History of falling: Secondary | ICD-10-CM | POA: Diagnosis not present

## 2023-01-28 DIAGNOSIS — Z1231 Encounter for screening mammogram for malignant neoplasm of breast: Secondary | ICD-10-CM | POA: Diagnosis not present

## 2023-02-11 DIAGNOSIS — I10 Essential (primary) hypertension: Secondary | ICD-10-CM | POA: Diagnosis not present

## 2023-02-11 DIAGNOSIS — Z23 Encounter for immunization: Secondary | ICD-10-CM | POA: Diagnosis not present

## 2023-02-11 DIAGNOSIS — N1831 Chronic kidney disease, stage 3a: Secondary | ICD-10-CM | POA: Diagnosis not present

## 2023-02-11 DIAGNOSIS — Z79899 Other long term (current) drug therapy: Secondary | ICD-10-CM | POA: Diagnosis not present

## 2023-02-11 DIAGNOSIS — F411 Generalized anxiety disorder: Secondary | ICD-10-CM | POA: Diagnosis not present

## 2023-02-11 DIAGNOSIS — R7303 Prediabetes: Secondary | ICD-10-CM | POA: Diagnosis not present

## 2023-02-11 DIAGNOSIS — Z6827 Body mass index (BMI) 27.0-27.9, adult: Secondary | ICD-10-CM | POA: Diagnosis not present

## 2023-02-11 DIAGNOSIS — E7800 Pure hypercholesterolemia, unspecified: Secondary | ICD-10-CM | POA: Diagnosis not present

## 2023-09-05 DIAGNOSIS — N1831 Chronic kidney disease, stage 3a: Secondary | ICD-10-CM | POA: Diagnosis not present

## 2023-09-05 DIAGNOSIS — I10 Essential (primary) hypertension: Secondary | ICD-10-CM | POA: Diagnosis not present

## 2023-09-05 DIAGNOSIS — Z79899 Other long term (current) drug therapy: Secondary | ICD-10-CM | POA: Diagnosis not present

## 2023-09-05 DIAGNOSIS — E7800 Pure hypercholesterolemia, unspecified: Secondary | ICD-10-CM | POA: Diagnosis not present

## 2023-09-05 DIAGNOSIS — F411 Generalized anxiety disorder: Secondary | ICD-10-CM | POA: Diagnosis not present

## 2023-09-05 DIAGNOSIS — R7303 Prediabetes: Secondary | ICD-10-CM | POA: Diagnosis not present

## 2023-09-05 DIAGNOSIS — Z6827 Body mass index (BMI) 27.0-27.9, adult: Secondary | ICD-10-CM | POA: Diagnosis not present

## 2023-09-16 DIAGNOSIS — H40023 Open angle with borderline findings, high risk, bilateral: Secondary | ICD-10-CM | POA: Diagnosis not present

## 2024-01-16 DIAGNOSIS — H40023 Open angle with borderline findings, high risk, bilateral: Secondary | ICD-10-CM | POA: Diagnosis not present

## 2024-02-05 DIAGNOSIS — Z1231 Encounter for screening mammogram for malignant neoplasm of breast: Secondary | ICD-10-CM | POA: Diagnosis not present

## 2024-02-19 DIAGNOSIS — E7800 Pure hypercholesterolemia, unspecified: Secondary | ICD-10-CM | POA: Diagnosis not present

## 2024-02-19 DIAGNOSIS — Z6827 Body mass index (BMI) 27.0-27.9, adult: Secondary | ICD-10-CM | POA: Diagnosis not present

## 2024-02-19 DIAGNOSIS — I10 Essential (primary) hypertension: Secondary | ICD-10-CM | POA: Diagnosis not present

## 2024-02-19 DIAGNOSIS — Z0000 Encounter for general adult medical examination without abnormal findings: Secondary | ICD-10-CM | POA: Diagnosis not present

## 2024-02-19 DIAGNOSIS — Z1331 Encounter for screening for depression: Secondary | ICD-10-CM | POA: Diagnosis not present

## 2024-02-19 DIAGNOSIS — N3946 Mixed incontinence: Secondary | ICD-10-CM | POA: Diagnosis not present

## 2024-02-19 DIAGNOSIS — Z23 Encounter for immunization: Secondary | ICD-10-CM | POA: Diagnosis not present

## 2024-02-19 DIAGNOSIS — R7301 Impaired fasting glucose: Secondary | ICD-10-CM | POA: Diagnosis not present

## 2024-03-19 DIAGNOSIS — M8589 Other specified disorders of bone density and structure, multiple sites: Secondary | ICD-10-CM | POA: Diagnosis not present

## 2024-03-25 DIAGNOSIS — N952 Postmenopausal atrophic vaginitis: Secondary | ICD-10-CM | POA: Diagnosis not present

## 2024-03-25 DIAGNOSIS — N3946 Mixed incontinence: Secondary | ICD-10-CM | POA: Diagnosis not present

## 2024-03-25 DIAGNOSIS — N8189 Other female genital prolapse: Secondary | ICD-10-CM | POA: Diagnosis not present

## 2024-05-03 DIAGNOSIS — H5203 Hypermetropia, bilateral: Secondary | ICD-10-CM | POA: Diagnosis not present
